# Patient Record
Sex: Male | Born: 2000 | Race: White | Hispanic: No | Marital: Single | Smoking: Never smoker
Health system: Southern US, Community
[De-identification: ages and names within clinical notes are randomized; demographics above are authoritative.]

## PROBLEM LIST (undated history)

## (undated) DIAGNOSIS — J309 Allergic rhinitis, unspecified: Secondary | ICD-10-CM

## (undated) DIAGNOSIS — R51 Headache: Secondary | ICD-10-CM

## (undated) HISTORY — PX: CIRCUMCISION: SUR203

## (undated) HISTORY — PX: CLAVICLE SURGERY: SHX598

## (undated) HISTORY — DX: Headache: R51

## (undated) HISTORY — DX: Allergic rhinitis, unspecified: J30.9

---
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
## 2019-09-08 ENCOUNTER — Ambulatory Visit: Payer: 59 | Admitting: Allergy and Immunology

## 2019-10-13 ENCOUNTER — Ambulatory Visit: Payer: 59 | Admitting: Neurology

## 2020-01-20 ENCOUNTER — Encounter: Payer: Self-pay | Admitting: Allergy and Immunology

## 2020-01-20 ENCOUNTER — Ambulatory Visit: Payer: PRIVATE HEALTH INSURANCE | Admitting: Allergy and Immunology

## 2020-01-20 ENCOUNTER — Other Ambulatory Visit: Payer: Self-pay

## 2020-01-20 VITALS — BP 122/60 | HR 82 | Temp 98.7°F | Resp 18 | Ht 72.0 in | Wt 190.8 lb

## 2020-01-20 DIAGNOSIS — H1010 Acute atopic conjunctivitis, unspecified eye: Secondary | ICD-10-CM | POA: Diagnosis not present

## 2020-01-20 DIAGNOSIS — J3089 Other allergic rhinitis: Secondary | ICD-10-CM

## 2020-01-20 DIAGNOSIS — J301 Allergic rhinitis due to pollen: Secondary | ICD-10-CM | POA: Diagnosis not present

## 2020-01-20 MED ORDER — MONTELUKAST SODIUM 10 MG PO TABS: 10.0000 mg | ORAL_TABLET | Freq: Every day | ORAL | 5 refills | Status: DC

## 2020-01-20 NOTE — Progress Notes (Signed)
[place] - [place] - [place] - [place] - [place]   Dear Dr. Luna Fuse,  Thank you for referring Dustin Davidson to the [facility] on 01/20/2020.   Below is a summation of this patient's evaluation and recommendations.  Thank you for your referral. I will keep you informed about this patient's response to treatment.   If you have any questions please do not hesitate to contact me.   Sincerely,  Jessica Priest, MD Allergy / Immunology [facility]   ______________________________________________________________________    NEW PATIENT NOTE  Referring Provider: Laurann Montana, MD Primary Provider: Laurann Montana, MD Date of office visit: 01/20/2020    Subjective:   Chief Complaint:  Dustin Davidson (DOB: [birth year]) is a 19 y.o. male who presents to the clinic on 01/20/2020 with a chief complaint of Allergic Rhinitis  .     HPI: Dustin Davidson presents to this clinic in evaluation of allergic disease.  He has a long history of allergies manifested as nasal congestion and sneezing and itchy red watery eyes especially following exposure to the outdoors and pollens and dogs and cats and dust that appears to occur on a perennial basis but flares with significant intensity during the spring of the year.  This has been a progressive issue and has become worse with each passing year.  This appears to be an issue even though he uses antihistamines on a regular basis and occasionally uses a nasal steroid.  He also appears to have a very distant history of asthma which for the most part has completely resolved.  He can now exercise and have cold air exposure without any problem at all and he has not required a short acting bronchodilator in years.  He has received 2 Covid vaccinations this year.  Past Medical History:  Diagnosis Date  . RJJOACZY(606.3)     Past Surgical History:    Procedure Laterality Date  . CIRCUMCISION  2001  . TYMPANOSTOMY TUBE PLACEMENT Bilateral 2003    Allergies as of 01/20/2020   No Known Allergies     Medication List    fexofenadine 180 MG tablet Commonly known as: ALLEGRA Take 180 mg by mouth daily as needed for allergies or rhinitis.   ibuprofen 200 MG tablet Commonly known as: ADVIL Take 400 mg by mouth every 6 (six) hours as needed. For pain       Review of systems negative except as noted in HPI / PMHx or noted below:  Review of Systems  Constitutional: Negative.   HENT: Negative.   Eyes: Negative.   Respiratory: Negative.   Cardiovascular: Negative.   Gastrointestinal: Negative.   Genitourinary: Negative.   Musculoskeletal: Negative.   Skin: Negative.   Neurological: Negative.   Endo/Heme/Allergies: Negative.   Psychiatric/Behavioral: Negative.     Family History  Problem Relation Age of Onset  . COPD Paternal Grandfather        Died at 22    Social History   Socioeconomic History  . Marital status: Single    Spouse name: Not on file  . Number of children: Not on file  . Years of education: Not on file  . Highest education level: Not on file  Occupational History  . Not on file  Tobacco Use  . Smoking status: Never Smoker  . Smokeless tobacco: Never Used  Substance and Sexual Activity  . Alcohol use: No  . Drug use: No  .  Sexual activity: Never  Other Topics Concern  . Not on file  Social History Narrative  . Not on file    Environmental and Social history  Lives in a house with a dry environment, 2 dogs located to the household, carpet in the bedroom, plastic on the bed, plastic on the pillow, no smoking with inside the household, and he is a Electronics engineer at [employer].  Objective:   Vitals:   01/20/20 1012  BP: 122/60  Pulse: 82  Resp: 18  Temp: 98.7 F (37.1 C)  SpO2: 98%   Height: 6' (182.9 cm) Weight: 190 lb 12.8 oz (86.5 kg)  Physical Exam Constitutional:       Appearance: He is not diaphoretic.  HENT:     Head: Normocephalic.     Right Ear: Tympanic membrane, ear canal and external ear normal.     Left Ear: Tympanic membrane, ear canal and external ear normal.     Nose: Nose normal. No mucosal edema or rhinorrhea.     Mouth/Throat:     Pharynx: Uvula midline. No oropharyngeal exudate.  Eyes:     Conjunctiva/sclera: Conjunctivae normal.  Neck:     Thyroid: No thyromegaly.     Trachea: Trachea normal. No tracheal tenderness or tracheal deviation.  Cardiovascular:     Rate and Rhythm: Normal rate and regular rhythm.     Heart sounds: Normal heart sounds, S1 normal and S2 normal. No murmur.  Pulmonary:     Effort: No respiratory distress.     Breath sounds: Normal breath sounds. No stridor. No wheezing or rales.  Lymphadenopathy:     Head:     Right side of head: No tonsillar adenopathy.     Left side of head: No tonsillar adenopathy.     Cervical: No cervical adenopathy.  Skin:    Findings: No erythema or rash.     Nails: There is no clubbing.  Neurological:     Mental Status: He is alert.     Diagnostics: Allergy skin tests were performed.  He demonstrated hypersensitivity to trees, grasses, weeds, dust mite, cat, dog  Assessment and Plan:    1. Perennial allergic rhinitis   2. Seasonal allergic rhinitis due to pollen   3. Seasonal allergic conjunctivitis     1.  Allergen avoidance measures - pollen / dust mite / cat / dog  2.  Treat and prevent inflammation:   A.  Montelukast 10 mg - 1 tablet 1 time per day  B.  OTC Nasacort - 1-2 sprays each nostril 1 time per day  3.  If needed:   A. OTC Cetirizine 10 mg - 1 tablet 1-2 times per day  B. OTC Pataday - 1 drop each eye 1 time per day  4.  Consider a course of immunotherapy  5.  Return to clinic in 1 year or earlier if problem  Dustin Davidson is very atopic and it will be difficult for him to perform adequate allergen avoidance measures.  He can continue to use  medications with anti-inflammatory agents directed against his respiratory tract as noted above.  He can consider starting a course of immunotherapy.  He is attending [employer] in [place] and would need to have this form of therapy administered at the school infirmary.  He will let us know his decision about starting immunotherapy within the near future.  Jiles Prows, MD Allergy / Immunology [facility] [place]

## 2020-01-20 NOTE — Patient Instructions (Addendum)
  1.  Allergen avoidance measures - pollen / dust mite / cat / dog  2.  Treat and prevent inflammation:   A.  Montelukast 10 mg - 1 tablet 1 time per day  B.  OTC Nasacort - 1-2 sprays each nostril 1 time per day  3.  If needed:   A. OTC Cetirizine 10 mg - 1 tablet 1-2 times per day  B. OTC Pataday - 1 drop each eye 1 time per day  4.  Consider a course of immunotherapy  5.  Return to clinic in 1 year or earlier if problem

## 2020-01-21 ENCOUNTER — Encounter: Payer: Self-pay | Admitting: Allergy and Immunology

## 2020-04-09 ENCOUNTER — Telehealth (HOSPITAL_COMMUNITY): Payer: Self-pay | Admitting: *Deleted

## 2020-04-09 DIAGNOSIS — Z8616 Personal history of COVID-19: Secondary | ICD-10-CM

## 2020-04-09 DIAGNOSIS — R0600 Dyspnea, unspecified: Secondary | ICD-10-CM

## 2020-04-09 NOTE — Telephone Encounter (Signed)
Per Dr Gala Romney pt recently had Covid and has not been feeling right since, he would like pt to come in for an echo, ekg, and Trop level next week, he will then call pt for virtual visits.  Orders placed, message to schedulers to arrange.

## 2020-04-13 ENCOUNTER — Other Ambulatory Visit: Payer: Self-pay

## 2020-04-13 ENCOUNTER — Ambulatory Visit (HOSPITAL_COMMUNITY)
Admission: RE | Admit: 2020-04-13 | Discharge: 2020-04-13 | Disposition: A | Discharge status: Home / Self Care | Payer: PRIVATE HEALTH INSURANCE | Source: Ambulatory Visit | Attending: Cardiology | Admitting: Cardiology

## 2020-04-13 ENCOUNTER — Ambulatory Visit (HOSPITAL_COMMUNITY)
Admission: RE | Admit: 2020-04-13 | Discharge: 2020-04-13 | Disposition: A | Discharge status: Home / Self Care | Payer: PRIVATE HEALTH INSURANCE | Source: Ambulatory Visit | Attending: Internal Medicine | Admitting: Internal Medicine

## 2020-04-13 DIAGNOSIS — R0600 Dyspnea, unspecified: Secondary | ICD-10-CM

## 2020-04-13 DIAGNOSIS — Z8616 Personal history of COVID-19: Secondary | ICD-10-CM | POA: Diagnosis not present

## 2020-04-13 LAB — ECHOCARDIOGRAM COMPLETE
Area-P 1/2: 1.84 cm2
S' Lateral: 3.6 cm

## 2020-04-13 LAB — TROPONIN I (HIGH SENSITIVITY): Troponin I (High Sensitivity): 3 ng/L (ref <18)

## 2020-04-13 NOTE — Progress Notes (Signed)
Echocardiogram 2D Echocardiogram has been performed.  Dustin Davidson 04/13/2020, 2:15 PM

## 2020-04-13 NOTE — Addendum Note (Signed)
Encounter addended by: Modesta Messing, CMA on: 04/13/2020 12:40 PM  Actions taken: Order list changed, Diagnosis association updated

## 2020-07-14 ENCOUNTER — Other Ambulatory Visit: Payer: Self-pay | Admitting: Allergy and Immunology

## 2021-02-10 ENCOUNTER — Telehealth: Payer: Self-pay | Admitting: Allergy and Immunology

## 2021-02-10 ENCOUNTER — Other Ambulatory Visit: Payer: Self-pay

## 2021-02-10 MED ORDER — MONTELUKAST SODIUM 10 MG PO TABS: ORAL_TABLET | ORAL | 1 refills | Status: DC

## 2021-02-10 NOTE — Telephone Encounter (Signed)
Dustin Davidson can have refills through the month of August 2022.

## 2021-02-10 NOTE — Telephone Encounter (Signed)
[facility] [place] and informed pt of me sending in enough refills until he is able to come home in august he stated understanding

## 2021-02-10 NOTE — Telephone Encounter (Signed)
Patient's mother called for a refill on montelukast, informed mother it has been over a year since he was since. Mother states patient is in [place], [place] for school and will not be back until August and he could make an appointment then. Mother would like to know if he could get enough refills until August. Patient get bad migraines without the medication.  Uses [facility] located at [address] [place] [address].  Phone number: [phone]

## 2021-04-06 ENCOUNTER — Other Ambulatory Visit: Payer: Self-pay | Admitting: Allergy and Immunology

## 2021-08-22 ENCOUNTER — Other Ambulatory Visit: Payer: Self-pay

## 2021-08-22 ENCOUNTER — Encounter: Payer: Self-pay | Admitting: Allergy and Immunology

## 2021-08-22 ENCOUNTER — Ambulatory Visit (INDEPENDENT_AMBULATORY_CARE_PROVIDER_SITE_OTHER): Payer: No Typology Code available for payment source | Admitting: Allergy and Immunology

## 2021-08-22 VITALS — BP 110/72 | HR 68 | Resp 16 | Ht 71.0 in | Wt 196.0 lb

## 2021-08-22 DIAGNOSIS — J3089 Other allergic rhinitis: Secondary | ICD-10-CM | POA: Diagnosis not present

## 2021-08-22 DIAGNOSIS — H1010 Acute atopic conjunctivitis, unspecified eye: Secondary | ICD-10-CM

## 2021-08-22 DIAGNOSIS — J301 Allergic rhinitis due to pollen: Secondary | ICD-10-CM | POA: Diagnosis not present

## 2021-08-22 MED ORDER — RYALTRIS 665-25 MCG/ACT NA SUSP: 2.0000 | Freq: Two times a day (BID) | NASAL | 11 refills | Status: DC

## 2021-08-22 NOTE — Progress Notes (Signed)
[place] - [place] - [place] - [place] - [place]   Follow-up Note  Referring Provider: Laurann Montana, MD Primary Provider: Laurann Montana, MD Date of Office Visit: 08/22/2021  Subjective:   Dustin Davidson (DOB: Dec 13, 2000) is a 20 y.o. male who [facility] on 08[facility]22/2021 in re-evaluation of the following:  HPI: Dustin Davidson returns to this clinic in evaluation of allergic rhinoconjunctivitis and a very distant history of asthma.  His last visit to this clinic was 20 Jan 2020.  He continues to have problems with his nose and eyes even though he has been using his montelukast and some antihistamines on a pretty consistent basis.  He is not using any nasal steroid at this point.  He has intermittent flareups throughout the year with lots of nasal congestion sniffing snorting sneezing and itchy red watery eyes.  Since he has been home for college break from [employer] [place] he has had exposure to a new puppy in the house and has been developing some problems with that exposure.    Asthma has been nonexistent.  He has received 3 COVID vaccines and the flu vaccine.  Allergies as of 08/22/2021   No Known Allergies      Medication List    montelukast 10 MG tablet Commonly known as: SINGULAIR TAKE 1 TABLET BY MOUTH EVERYDAY AT BEDTIME   rizatriptan 10 MG tablet Commonly known as: MAXALT SMARTSIG:1 Tablet(s) By Mouth 1-2 Times Daily   Ryaltris 665-25 MCG/ACT Susp Generic drug: Olopatadine-Mometasone Place 2 sprays into both nostrils 2 (two) times daily. Started by: Jessica Priest, MD   zonisamide 100 MG capsule Commonly known as: ZONEGRAN Take 100 mg by mouth daily.    Past Medical History:  Diagnosis Date   Allergic rhinitis    Headache(784.0)     Past Surgical History:  Procedure Laterality Date   CIRCUMCISION  2001   TYMPANOSTOMY TUBE PLACEMENT Bilateral 2003    Review of systems negative except as noted in HPI /  PMHx or noted below:  Review of Systems  Constitutional: Negative.   HENT: Negative.    Eyes: Negative.   Respiratory: Negative.    Cardiovascular: Negative.   Gastrointestinal: Negative.   Genitourinary: Negative.   Musculoskeletal: Negative.   Skin: Negative.   Neurological: Negative.   Endo/Heme/Allergies: Negative.   Psychiatric/Behavioral: Negative.      Objective:   Vitals:   08/22/21 1103  BP: 110/72  Pulse: 68  Resp: 16  SpO2: 97%   Height: 5\' 11"  (180.3 cm)  Weight: 196 lb (88.9 kg)   Physical Exam Constitutional:      Appearance: He is not diaphoretic.  HENT:     Head: Normocephalic.     Right Ear: Tympanic membrane, ear canal and external ear normal.     Left Ear: Tympanic membrane, ear canal and external ear normal.     Nose: Nose normal. No mucosal edema or rhinorrhea.     Mouth/Throat:     Pharynx: Uvula midline. No oropharyngeal exudate.  Eyes:     Conjunctiva/sclera: Conjunctivae normal.  Neck:     Thyroid: No thyromegaly.     Trachea: Trachea normal. No tracheal tenderness or tracheal deviation.  Cardiovascular:     Rate and Rhythm: Normal rate and regular rhythm.     Heart sounds: Normal heart sounds, S1 normal and S2 normal. No murmur heard. Pulmonary:     Effort: No respiratory distress.     Breath sounds: Normal breath  sounds. No stridor. No wheezing or rales.  Lymphadenopathy:     Head:     Right side of head: No tonsillar adenopathy.     Left side of head: No tonsillar adenopathy.     Cervical: No cervical adenopathy.  Skin:    Findings: No erythema or rash.     Nails: There is no clubbing.  Neurological:     Mental Status: He is alert.    Diagnostics:    Spirometry was performed and demonstrated an FEV1 of 1.69 at 103 % of predicted.  Assessment and Plan:   1. Perennial allergic rhinitis   2. Seasonal allergic rhinitis due to pollen   3. Seasonal allergic conjunctivitis     1.  Allergen avoidance measures - pollen / dust  mite / cat / dog  2.  Treat and prevent inflammation:   A.  Montelukast 10 mg - 1 tablet 1 time per day  B.  Ryaltris - 2 sprays each nostril 1-2 times per day (specialty pharmacy)  C.  Prednisone 10 mg - 1 tablet 1 time per day for 7 days only  3.  If needed:   A. OTC Cetirizine 10 mg - 1 tablet 1-2 times per day  B. OTC Pataday - 1 drop each eye 1 time per day  4.  Consider a course of immunotherapy  5.  Return to clinic in 1 year or earlier if problem  I will have Dustin Davidson use a combination of a leukotrienes modifier and a nasal steroid and nasal antihistamine on a pretty consistent basis in an attempt to get his atopic respiratory disease under better control.  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  We will see him back in his clinic in 1 year or earlier if there is a problem.  Laurette Schimke, MD Allergy / Immunology  [facility]

## 2021-08-22 NOTE — Patient Instructions (Addendum)
°  1.  Allergen avoidance measures - pollen / dust mite / cat / dog  2.  Treat and prevent inflammation:   A.  Montelukast 10 mg - 1 tablet 1 time per day  B.  Ryaltris - 2 sprays each nostril 1-2 times per day (specialty pharmacy)  C.  Prednisone 10 mg - 1 tablet 1 time per day for 10 days only  3.  If needed:   A. OTC Cetirizine 10 mg - 1 tablet 1-2 times per day  B. OTC Pataday - 1 drop each eye 1 time per day  4.  Consider a course of immunotherapy  5.  Return to clinic in 1 year or earlier if problem

## 2021-08-23 ENCOUNTER — Encounter: Payer: Self-pay | Admitting: Allergy and Immunology

## 2022-03-29 ENCOUNTER — Telehealth: Payer: Self-pay | Admitting: Allergy

## 2022-03-29 MED ORDER — RYALTRIS 665-25 MCG/ACT NA SUSP: 2.0000 | Freq: Two times a day (BID) | NASAL | 5 refills | Status: DC

## 2022-03-29 NOTE — Telephone Encounter (Signed)
Patient is requesting refill for ryaltris. Advised pt it would be sent to [facility] and they would reach out to him to set up shipping.  Patient verbalized understanding.

## 2022-03-29 NOTE — Telephone Encounter (Signed)
Refill was sent to [facility]. The prescription no longer goes to [facility].

## 2022-08-22 ENCOUNTER — Ambulatory Visit: Payer: No Typology Code available for payment source | Admitting: Allergy and Immunology

## 2022-08-25 ENCOUNTER — Ambulatory Visit: Payer: PRIVATE HEALTH INSURANCE | Admitting: Internal Medicine

## 2022-08-25 ENCOUNTER — Other Ambulatory Visit: Payer: Self-pay

## 2022-08-25 ENCOUNTER — Encounter: Payer: Self-pay | Admitting: Internal Medicine

## 2022-08-25 VITALS — BP 110/60 | HR 80 | Temp 98.5°F | Resp 20 | Ht 71.0 in | Wt 202.6 lb

## 2022-08-25 DIAGNOSIS — J3089 Other allergic rhinitis: Secondary | ICD-10-CM

## 2022-08-25 DIAGNOSIS — J301 Allergic rhinitis due to pollen: Secondary | ICD-10-CM

## 2022-08-25 DIAGNOSIS — H1013 Acute atopic conjunctivitis, bilateral: Secondary | ICD-10-CM

## 2022-08-25 DIAGNOSIS — H1010 Acute atopic conjunctivitis, unspecified eye: Secondary | ICD-10-CM

## 2022-08-25 MED ORDER — OLOPATADINE HCL 0.2 % OP SOLN: 1.0000 [drp] | Freq: Every day | OPHTHALMIC | 11 refills | Status: AC | PRN

## 2022-08-25 MED ORDER — RYALTRIS 665-25 MCG/ACT NA SUSP: 2.0000 | Freq: Two times a day (BID) | NASAL | 11 refills | Status: AC

## 2022-08-25 MED ORDER — MONTELUKAST SODIUM 10 MG PO TABS: 10.0000 mg | ORAL_TABLET | Freq: Every day | ORAL | 11 refills | Status: DC

## 2022-08-25 NOTE — Progress Notes (Signed)
   FOLLOW UP Date of Service/Encounter:  08/25/22   Subjective:  Dustin Davidson (DOB: 04-29-2001) is a 21 y.o. male who [facility] for follow up for allergic rhinoconjunctivitis.   History obtained from: chart review and patient.  Last visit was with Dr. Lucie Leather 08/22/2021.  At the time, he was controlled on Singulair daily, Ryaltris 1 SEN BID, Zyrtec/Pataday PRN.  Since last visit, he reports doing well.  He did have a flare up of allergies with the weather change that lasted for a few days with runny nose and congestion.  He also had another one recently about 3 weeks ago where he felt very congested and used Afrin for a few days.  Sometimes does have itchy watery eyes but not too bothersome as long as he uses his eye drops Pataday.  He is taking Singulair 10mg  daily, Ryaltris 1 SEN BID, rarely Zyrtec and Pataday.  No oral prednisone use.  Rarely takes Benadryl.   Past Medical History: Past Medical History:  Diagnosis Date   Allergic rhinitis    Headache(784.0)     Objective:  BP 110/60 (BP Location: Right Arm, Patient Position: Sitting, Cuff Size: Large)   Pulse 80   Temp 98.5 F (36.9 C) (Temporal)   Resp 20   Ht 5\' 11"  (1.803 m)   Wt 202 lb 9.6 oz (91.9 kg)   SpO2 96%   BMI 28.26 kg/m  Body mass index is 28.26 kg/m. Physical Exam: GEN: alert, well developed HEENT: clear conjunctiva, TM grey and translucent, nose with moderate inferior turbinate hypertrophy, pink nasal mucosa, no rhinorrhea, no cobblestoning HEART: regular rate and rhythm, no murmur LUNGS: clear to auscultation bilaterally, no coughing, unlabored respiration SKIN: no rashes or lesions   Assessment/Plan  Allergic Rhinitis, Controlled Allergic Conjunctivitis, Controlled 1.  Allergen avoidance measures - pollen / dust mite / cat / dog  2.  Treat and prevent inflammation:   A.  Montelukast 10 mg - 1 tablet 1 time per day  B.  Ryaltris - 2 sprays each  nostril 1-2 times per day (specialty pharmacy)  3.  If needed:   A. OTC Cetirizine 10 mg - 1 tablet 1-2 times per day instead of benadryl.   B. OTC Pataday - 1 drop each eye 1 time per day  4.  Consider a course of immunotherapy  5.  Return to clinic in 1 year or earlier if problem   Return in about 1 year (around 08/26/2023). , MD  [facility]

## 2022-08-25 NOTE — Patient Instructions (Addendum)
  1.  Allergen avoidance measures - pollen / dust mite / cat / dog  2.  Treat and prevent inflammation:   A.  Montelukast 10 mg - 1 tablet 1 time per day  B.  Ryaltris - 2 sprays each nostril 1-2 times per day (specialty pharmacy)  3.  If needed:   A. OTC Cetirizine 10 mg - 1 tablet 1-2 times per day  B. OTC Pataday - 1 drop each eye 1 time per day  4.  Consider a course of immunotherapy  5.  Return to clinic in 1 year or earlier if problem

## 2022-08-30 ENCOUNTER — Ambulatory Visit: Payer: No Typology Code available for payment source | Admitting: Family Medicine

## 2022-11-04 ENCOUNTER — Other Ambulatory Visit: Payer: Self-pay | Admitting: Internal Medicine

## 2023-04-28 ENCOUNTER — Other Ambulatory Visit: Payer: Self-pay | Admitting: Internal Medicine

## 2023-08-21 ENCOUNTER — Other Ambulatory Visit: Payer: Self-pay

## 2024-08-28 ENCOUNTER — Other Ambulatory Visit: Payer: Self-pay

## 2024-08-28 ENCOUNTER — Emergency Department (HOSPITAL_BASED_OUTPATIENT_CLINIC_OR_DEPARTMENT_OTHER)

## 2024-08-28 ENCOUNTER — Encounter (HOSPITAL_BASED_OUTPATIENT_CLINIC_OR_DEPARTMENT_OTHER): Payer: Self-pay

## 2024-08-28 ENCOUNTER — Emergency Department (HOSPITAL_BASED_OUTPATIENT_CLINIC_OR_DEPARTMENT_OTHER)
Admission: EM | Admit: 2024-08-28 | Discharge: 2024-08-28 | Disposition: A | Discharge status: Home / Self Care | Attending: Emergency Medicine | Admitting: Emergency Medicine

## 2024-08-28 DIAGNOSIS — S62392A Other fracture of third metacarpal bone, right hand, initial encounter for closed fracture: Secondary | ICD-10-CM | POA: Insufficient documentation

## 2024-08-28 DIAGNOSIS — Y9239 Other specified sports and athletic area as the place of occurrence of the external cause: Secondary | ICD-10-CM | POA: Insufficient documentation

## 2024-08-28 DIAGNOSIS — Y93B9 Activity, other involving muscle strengthening exercises: Secondary | ICD-10-CM | POA: Diagnosis not present

## 2024-08-28 DIAGNOSIS — M7989 Other specified soft tissue disorders: Secondary | ICD-10-CM | POA: Diagnosis present

## 2024-08-28 DIAGNOSIS — W231XXA Caught, crushed, jammed, or pinched between stationary objects, initial encounter: Secondary | ICD-10-CM | POA: Diagnosis not present

## 2024-08-28 NOTE — ED Provider Notes (Signed)
 " [place] EMERGENCY DEPARTMENT AT [facility] Provider Note   CSN: [id number] Arrival date & time: 08/28/24  1350     Patient presents with: Hand Injury   Dustin Davidson is a 23 y.o. male.  {Add pertinent medical, surgical, social history, OB history to YEP:67052}  Hand Injury    Patient presents ED for right hand injury.  Patient states he was doing exercises at a gym.  He slammed his hand into a box.  Patient is now having significant pain in his right hand.  He is concerned he might of broken something he denies any other injury  Prior to Admission medications  Medication Sig Start Date End Date Taking? Authorizing Provider  montelukast  (SINGULAIR ) 10 MG tablet TAKE 1 TABLET (10MG ) BY MOUTH AT BEDTIME 04/30/23   Tobie Arleta SQUIBB, MD  Olopatadine  HCl 0.2 % SOLN Apply 1 drop to eye daily as needed (itchy watery eyes). 08/25/22   Tobie Arleta SQUIBB, MD  rizatriptan (MAXALT) 10 MG tablet SMARTSIG:1 Tablet(s) By Mouth 1-2 Times Daily 06/01/21   [provider]  RYALTRIS  334-74 MCG/ACT SUSP Place 2 sprays into both nostrils 2 (two) times daily. 08/25/22   Tobie Arleta SQUIBB, MD  zonisamide (ZONEGRAN) 100 MG capsule Take 100 mg by mouth daily. 06/01/21   [provider]    Allergies: Cat dander, Dog epithelium, and Other    Review of Systems  Updated Vital Signs BP 128/76 (BP Location: Left Arm)   Pulse 77   Temp 98.5 F (36.9 C) (Oral)   Resp 12   SpO2 95%   Physical Exam Vitals and nursing note reviewed.  Constitutional:      General: He is not in acute distress.    Appearance: He is well-developed.  HENT:     Head: Normocephalic and atraumatic.     Right Ear: External ear normal.     Left Ear: External ear normal.  Eyes:     General: No scleral icterus.       Right eye: No discharge.        Left eye: No discharge.     Conjunctiva/sclera: Conjunctivae normal.  Neck:     Trachea: No tracheal deviation.  Cardiovascular:     Rate and Rhythm:  Normal rate.  Pulmonary:     Effort: Pulmonary effort is normal. No respiratory distress.     Breath sounds: No stridor.  Abdominal:     General: There is no distension.  Musculoskeletal:        General: Tenderness present. No swelling or deformity.     Cervical back: Neck supple.     Comments: Ttp right hand, no echhymoses, nv intact  Skin:    General: Skin is warm and dry.     Findings: No rash.  Neurological:     Mental Status: He is alert. Mental status is at baseline.     Cranial Nerves: No dysarthria or facial asymmetry.     Motor: No seizure activity.     (all labs ordered are listed, but only abnormal results are displayed) Labs Reviewed - No data to display  EKG: None  Radiology: No results found.  {Document cardiac monitor, telemetry assessment procedure when appropriate:32947} Procedures   Medications Ordered in the ED - No data to display    {Click here for ABCD2, HEART and other calculators REFRESH Note before signing:1}  Medical Decision Making Amount and/or Complexity of Data Reviewed Radiology: ordered.   ***  {Document critical care time when appropriate  Document review of labs and clinical decision tools ie CHADS2VASC2, etc  Document your independent review of radiology images and any outside records  Document your discussion with family members, caretakers and with consultants  Document social determinants of health affecting pt's care  Document your decision making why or why not admission, treatments were needed:32947:::1}   Final diagnoses:  None    ED Discharge Orders     None        "

## 2024-08-28 NOTE — Discharge Instructions (Addendum)
 Apply ice to help with the swelling.  Take over-the-counter medication such as ibuprofen or Naprosyn to help with the pain and discomfort.  Follow-up with the orthopedic doctor as we discussed for further evaluation

## 2024-08-28 NOTE — ED Triage Notes (Signed)
 Right hand injury. Was doing box jumps at gym and struck his hand on the box. Happened today at approx 1215 today.  Took tylenol at approx 1255.
